# Patient Record
Sex: Female | Born: 1965 | Race: Black or African American | Hispanic: No | Marital: Single | Smoking: Never smoker
Health system: Southern US, Community
[De-identification: ages and names within clinical notes are randomized; demographics above are authoritative.]

## PROBLEM LIST (undated history)

## (undated) DIAGNOSIS — F329 Major depressive disorder, single episode, unspecified: Secondary | ICD-10-CM

## (undated) DIAGNOSIS — F32A Depression, unspecified: Secondary | ICD-10-CM

## (undated) HISTORY — PX: TUBAL LIGATION: SHX77

---

## 1999-06-12 ENCOUNTER — Other Ambulatory Visit: Admission: RE | Admit: 1999-06-12 | Discharge: 1999-06-12 | Payer: Self-pay | Admitting: *Deleted

## 1999-11-28 ENCOUNTER — Inpatient Hospital Stay (HOSPITAL_COMMUNITY): Admission: AD | Admit: 1999-11-28 | Discharge: 1999-11-28 | Payer: Self-pay | Admitting: *Deleted

## 2000-01-31 ENCOUNTER — Ambulatory Visit (HOSPITAL_COMMUNITY): Admission: RE | Admit: 2000-01-31 | Discharge: 2000-01-31 | Payer: Self-pay | Admitting: *Deleted

## 2000-06-22 ENCOUNTER — Inpatient Hospital Stay (HOSPITAL_COMMUNITY): Admission: AD | Admit: 2000-06-22 | Discharge: 2000-06-24 | Payer: Self-pay | Admitting: *Deleted

## 2000-08-16 ENCOUNTER — Emergency Department (HOSPITAL_COMMUNITY): Admission: EM | Admit: 2000-08-16 | Discharge: 2000-08-16 | Payer: Self-pay | Admitting: Internal Medicine

## 2000-08-19 ENCOUNTER — Emergency Department (HOSPITAL_COMMUNITY): Admission: EM | Admit: 2000-08-19 | Discharge: 2000-08-19 | Payer: Self-pay | Admitting: Emergency Medicine

## 2002-09-30 ENCOUNTER — Encounter: Payer: Self-pay | Admitting: Internal Medicine

## 2002-09-30 ENCOUNTER — Encounter: Admission: RE | Admit: 2002-09-30 | Discharge: 2002-09-30 | Payer: Self-pay | Admitting: Internal Medicine

## 2005-09-25 ENCOUNTER — Encounter: Admission: RE | Admit: 2005-09-25 | Discharge: 2005-09-25 | Payer: Self-pay | Admitting: General Surgery

## 2008-03-22 ENCOUNTER — Encounter: Admission: RE | Admit: 2008-03-22 | Discharge: 2008-03-22 | Payer: Self-pay | Admitting: Family Medicine

## 2009-06-02 ENCOUNTER — Encounter: Admission: RE | Admit: 2009-06-02 | Discharge: 2009-06-02 | Payer: Self-pay | Admitting: Family Medicine

## 2009-07-28 ENCOUNTER — Encounter: Admission: RE | Admit: 2009-07-28 | Discharge: 2009-07-28 | Payer: Self-pay | Admitting: Family Medicine

## 2010-11-22 ENCOUNTER — Other Ambulatory Visit: Payer: Self-pay | Admitting: *Deleted

## 2010-11-22 DIAGNOSIS — Z1231 Encounter for screening mammogram for malignant neoplasm of breast: Secondary | ICD-10-CM

## 2010-11-27 ENCOUNTER — Ambulatory Visit
Admission: RE | Admit: 2010-11-27 | Discharge: 2010-11-27 | Disposition: A | Discharge status: Home / Self Care | Payer: 59 | Source: Ambulatory Visit | Attending: *Deleted | Admitting: *Deleted

## 2010-11-27 DIAGNOSIS — Z1231 Encounter for screening mammogram for malignant neoplasm of breast: Secondary | ICD-10-CM

## 2012-07-21 ENCOUNTER — Other Ambulatory Visit: Payer: Self-pay | Admitting: Family Medicine

## 2012-07-21 DIAGNOSIS — N644 Mastodynia: Secondary | ICD-10-CM

## 2012-07-29 ENCOUNTER — Ambulatory Visit
Admission: RE | Admit: 2012-07-29 | Discharge: 2012-07-29 | Disposition: A | Discharge status: Home / Self Care | Payer: 59 | Source: Ambulatory Visit | Attending: Family Medicine | Admitting: Family Medicine

## 2012-07-29 DIAGNOSIS — N644 Mastodynia: Secondary | ICD-10-CM

## 2013-03-13 IMAGING — MG MM DIGITAL DIAGNOSTIC BILAT CAD
4 series · 4 of 4 positions shown · non-contrast
Comparison: 11/27/2010, 07/28/2009, 06/02/2009, 03/22/2008

CLINICAL DATA: The patient has been experiencing pain in the upper
half of each breast for 1 month.

DIGITAL DIAGNOSTIC BILATERAL MAMMOGRAM WITH CAD

[R CC]
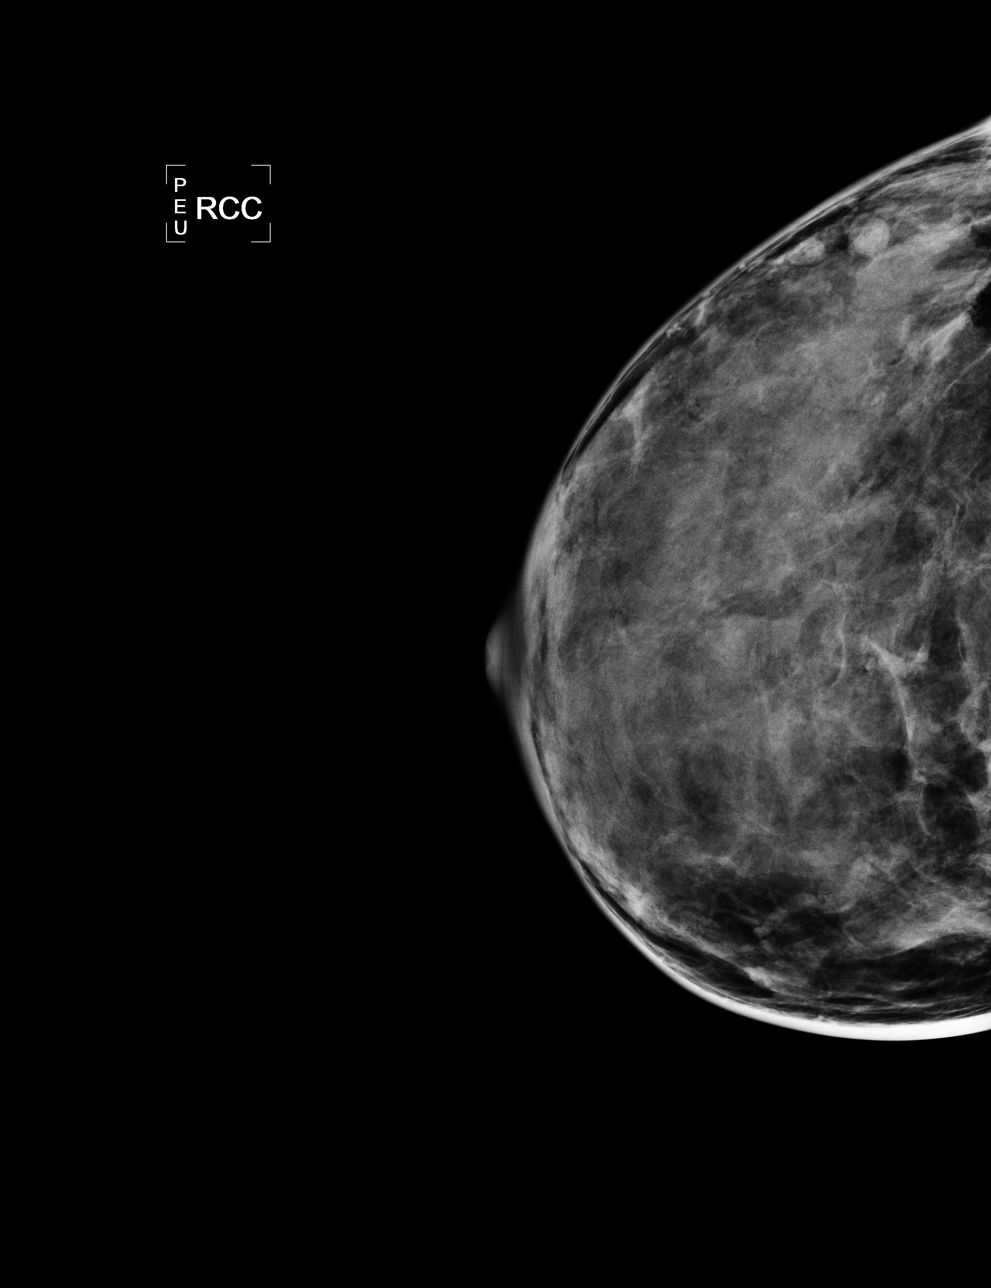

[L CC]
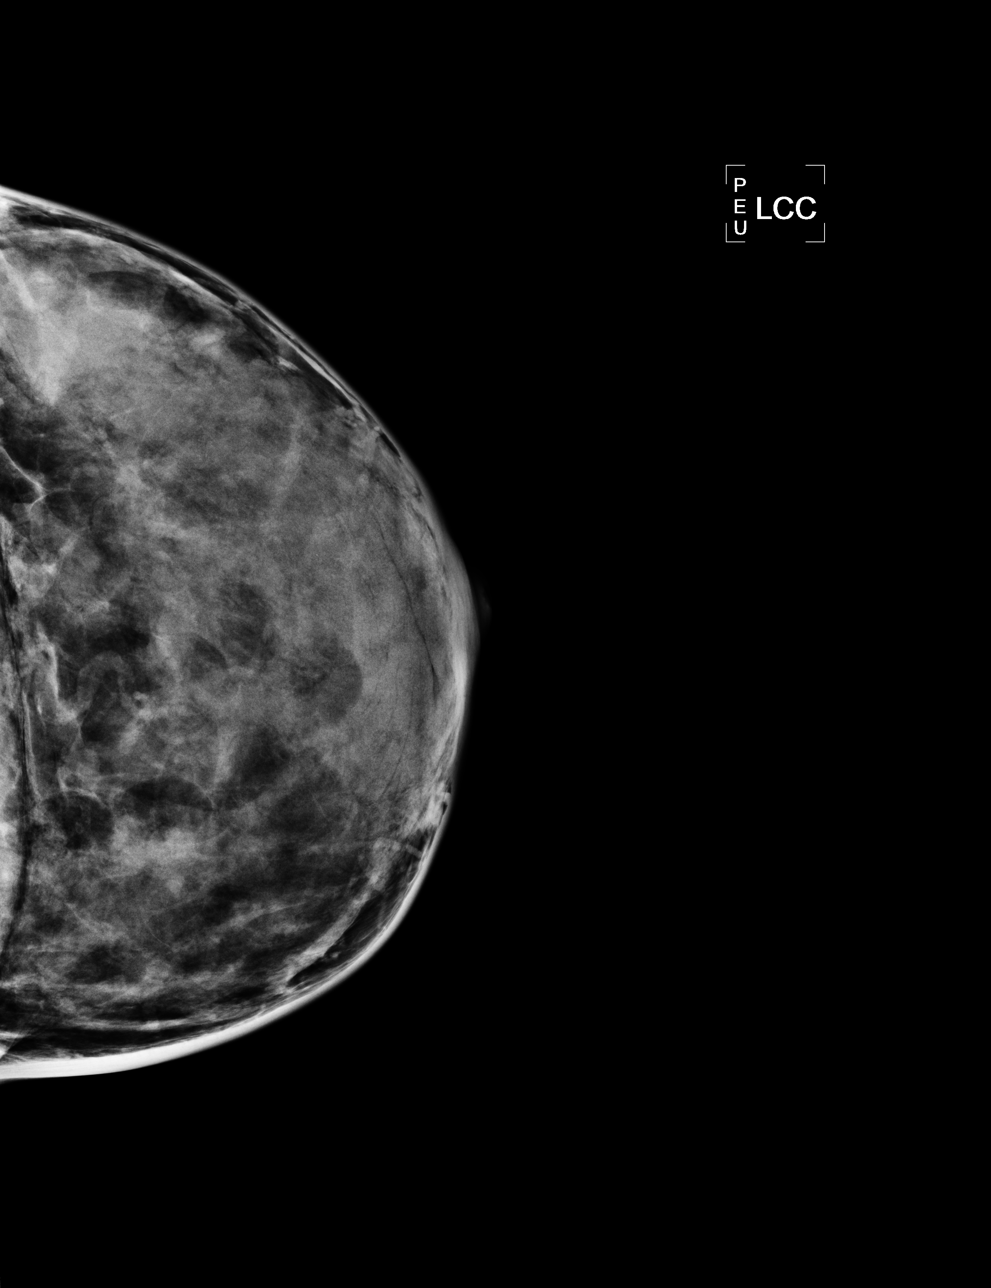

[L MLO]
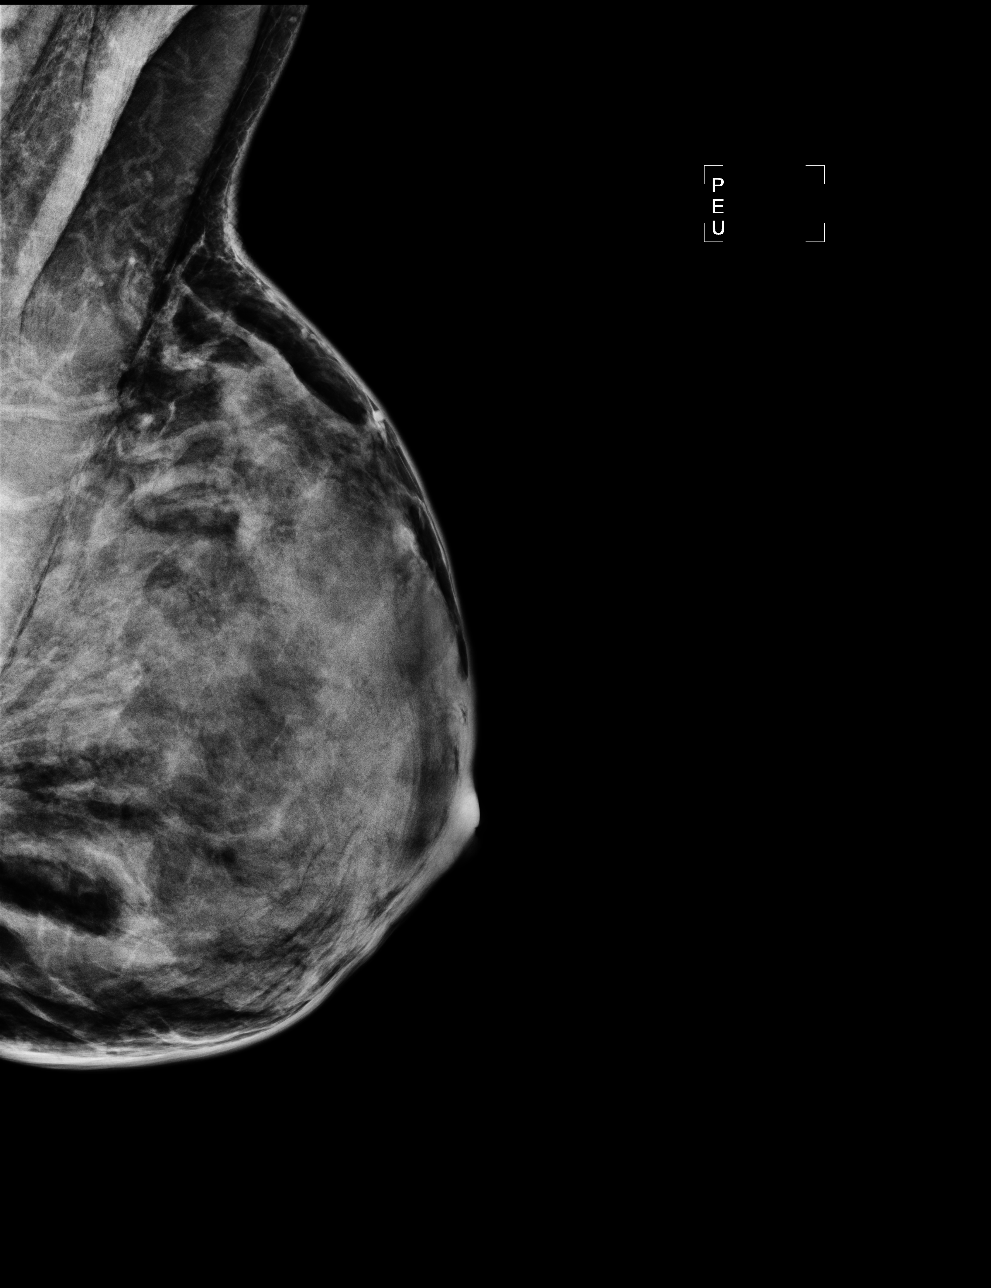

[R MLO]
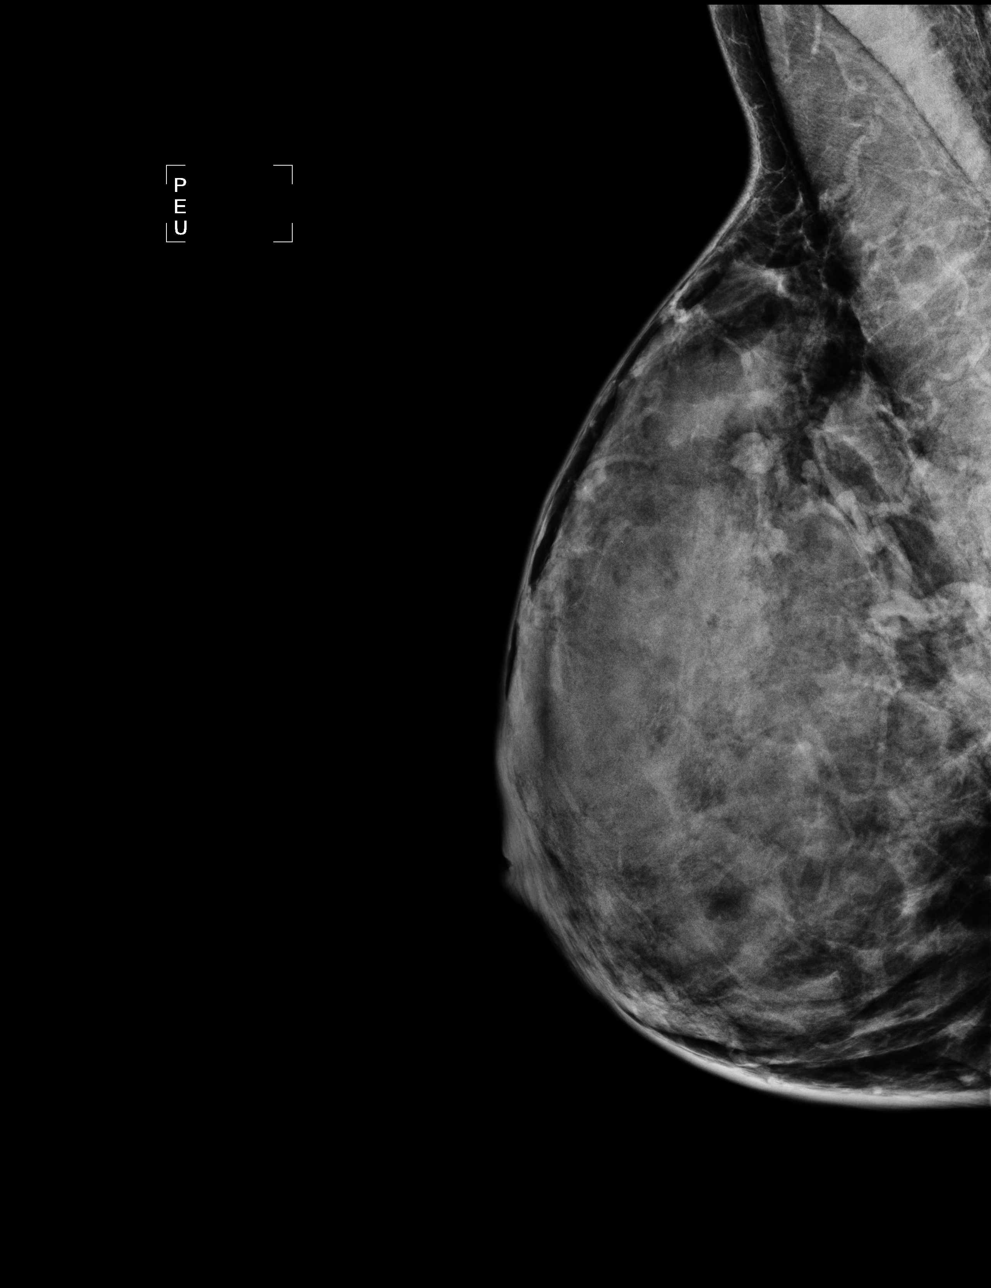

[4 of 4 positions shown; findings below may reference images not displayed]

FINDINGS: The breast tissue is extremely dense.  There is no
suspicious dominant mass, architectural distortion or calcification
to suggest malignancy.
Mammographic images were processed with CAD.
IMPRESSION: No mammographic evidence of malignancy.

RECOMMENDATION:
Yearly screening mammography is suggested.

I have discussed the findings and recommendations with the patient.
Results were also provided in writing at the conclusion of the
visit.

BI-RADS CATEGORY 1:  Negative.

## 2013-11-03 ENCOUNTER — Encounter (HOSPITAL_BASED_OUTPATIENT_CLINIC_OR_DEPARTMENT_OTHER): Payer: Self-pay | Admitting: Emergency Medicine

## 2013-11-03 ENCOUNTER — Emergency Department (HOSPITAL_BASED_OUTPATIENT_CLINIC_OR_DEPARTMENT_OTHER)
Admission: EM | Admit: 2013-11-03 | Discharge: 2013-11-03 | Disposition: A | Discharge status: Home / Self Care | Payer: 59 | Attending: Emergency Medicine | Admitting: Emergency Medicine

## 2013-11-03 DIAGNOSIS — Z79899 Other long term (current) drug therapy: Secondary | ICD-10-CM | POA: Insufficient documentation

## 2013-11-03 DIAGNOSIS — M545 Low back pain, unspecified: Secondary | ICD-10-CM | POA: Insufficient documentation

## 2013-11-03 DIAGNOSIS — R109 Unspecified abdominal pain: Secondary | ICD-10-CM | POA: Insufficient documentation

## 2013-11-03 DIAGNOSIS — F3289 Other specified depressive episodes: Secondary | ICD-10-CM | POA: Insufficient documentation

## 2013-11-03 DIAGNOSIS — F329 Major depressive disorder, single episode, unspecified: Secondary | ICD-10-CM | POA: Insufficient documentation

## 2013-11-03 HISTORY — DX: Depression, unspecified: F32.A

## 2013-11-03 HISTORY — DX: Major depressive disorder, single episode, unspecified: F32.9

## 2013-11-03 LAB — URINALYSIS, ROUTINE W REFLEX MICROSCOPIC
Bilirubin Urine: NEGATIVE
Glucose, UA: NEGATIVE mg/dL
HGB URINE DIPSTICK: NEGATIVE
Ketones, ur: NEGATIVE mg/dL
Leukocytes, UA: NEGATIVE
NITRITE: NEGATIVE
PH: 6 (ref 5.0–8.0)
Protein, ur: NEGATIVE mg/dL
SPECIFIC GRAVITY, URINE: 1.024 (ref 1.005–1.030)
UROBILINOGEN UA: 0.2 mg/dL (ref 0.0–1.0)

## 2013-11-03 MED ORDER — IBUPROFEN 800 MG PO TABS
800.0000 mg | ORAL_TABLET | Freq: Once | ORAL | Status: AC
  Administered 2013-11-03: 800 mg via ORAL
  Filled 2013-11-03: qty 1

## 2013-11-03 MED ORDER — NAPROXEN 500 MG PO TABS: 500.0000 mg | ORAL_TABLET | Freq: Two times a day (BID) | ORAL | Status: AC

## 2013-11-03 MED ORDER — OXYCODONE-ACETAMINOPHEN 5-325 MG PO TABS: 1.0000 | ORAL_TABLET | ORAL | Status: AC | PRN

## 2013-11-03 MED ORDER — ORPHENADRINE CITRATE ER 100 MG PO TB12: 100.0000 mg | ORAL_TABLET | Freq: Two times a day (BID) | ORAL | Status: AC

## 2013-11-03 NOTE — ED Notes (Signed)
Pt reports bilateral lower back pain that radiates to bilateral flank, states that she took tylenol yesterday and pain went away, however pain returned today and did not go away with tylenol, denies difficulty voiding or with bowels moving. Denies musculoskeletal injury. No radiation of pain to lower extremeties

## 2013-11-03 NOTE — ED Notes (Signed)
MD at bedside. 

## 2013-11-03 NOTE — ED Provider Notes (Signed)
CSN: [id number]     Arrival date & time 11/03/13  2236 History   None    Chief Complaint  Patient presents with  . Flank Pain     (Consider location/radiation/quality/duration/timing/severity/associated sxs/prior Treatment) Patient is a 48 y.o. female presenting with flank pain. The history is provided by the patient.  Flank Pain  She complains of pain across her lower back the last 4 days. Pain does not radiate. It is worse with movement or bending. Pain is moderately severe and she rates it at 7/10. Initially, acetaminophen and gave her relief. However, today, acetaminophen did not give her any relief. She denies any urinary urgency, frequency, tenesmus, dysuria. She denies any weakness, numbness, tingling. There's been no bowel or bladder dysfunction. She denies any recent trauma or unusual lifting or bending. She has had back pain in the past.  Past Medical History  Diagnosis Date  . Depression    Past Surgical History  Procedure Laterality Date  . Cesarean section    . Tubal ligation     No family history on file. History  Substance Use Topics  . Smoking status: Never Smoker   . Smokeless tobacco: Not on file  . Alcohol Use: No   OB History   Grav Para Term Preterm Abortions TAB SAB Ect Mult Living                 Review of Systems  Genitourinary: Positive for flank pain.  All other systems reviewed and are negative.      Allergies  Review of patient's allergies indicates no known allergies.  Home Medications   Current Outpatient Rx  Name  Route  Sig  Dispense  Refill  . buPROPion (WELLBUTRIN) 100 MG tablet   Oral   Take 100 mg by mouth daily.          BP 113/72  Pulse 74  Temp(Src) 98.3 F (36.8 C) (Oral)  Resp 18  Ht 5\' 7"  (1.702 m)  Wt 130 lb (58.968 kg)  BMI 20.36 kg/m2  SpO2 99%  LMP 10/09/2013 Physical Exam  Nursing note and vitals reviewed.  48 year old female, resting comfortably and in no acute distress. Vital signs are normal.  Oxygen saturation is 99%, which is normal. Head is normocephalic and atraumatic. PERRLA, EOMI. Oropharynx is clear. Neck is nontender and supple without adenopathy or JVD. Back is is mildly tender in the mid lumbar area. There is moderate paralumbar spasm which is worse on the right. Straight leg raise is negative. There is no CVA tenderness. Lungs are clear without rales, wheezes, or rhonchi. Chest is nontender. Heart has regular rate and rhythm without murmur. Abdomen is soft, flat, nontender without masses or hepatosplenomegaly and peristalsis is normoactive. Extremities have no cyanosis or edema, full range of motion is present. Skin is warm and dry without rash. Neurologic: Mental status is normal, cranial nerves are intact, there are no motor or sensory deficits.  ED Course  Procedures (including critical care time) Labs Review Results for orders placed during the hospital encounter of 11/03/13  URINALYSIS, ROUTINE W REFLEX MICROSCOPIC      Result Value Ref Range   Color, Urine YELLOW  YELLOW   APPearance CLEAR  CLEAR   Specific Gravity, Urine 1.024  1.005 - 1.030   pH 6.0  5.0 - 8.0   Glucose, UA NEGATIVE  NEGATIVE mg/dL   Hgb urine dipstick NEGATIVE  NEGATIVE   Bilirubin Urine NEGATIVE  NEGATIVE   Ketones, ur NEGATIVE  NEGATIVE mg/dL   Protein, ur NEGATIVE  NEGATIVE mg/dL   Urobilinogen, UA 0.2  0.0 - 1.0 mg/dL   Nitrite NEGATIVE  NEGATIVE   Leukocytes, UA NEGATIVE  NEGATIVE   MDM   Final diagnoses:  Low back pain    Musculoskeletal low-back pain. No indication for imaging today. Urinalysis is normal. She is discharged with prescriptions for naproxen, orphenadrine, and oxycodone-acetaminophen.    Dione Boozeavid Braxton Vantrease, MD 11/03/13 [phone]

## 2013-11-03 NOTE — ED Notes (Signed)
Bilateral flank pain intermittently x4 days. Increased pain with movement.

## 2013-11-03 NOTE — Discharge Instructions (Signed)
Back Pain, Adult °Low back pain is very common. About 1 in 5 people have back pain. The cause of low back pain is rarely dangerous. The pain often gets better over time. About half of people with a sudden onset of back pain feel better in just 2 weeks. About 8 in 10 people feel better by 6 weeks.  °CAUSES °Some common causes of back pain include: °· Strain of the muscles or ligaments supporting the spine. °· Wear and tear (degeneration) of the spinal discs. °· Arthritis. °· Direct injury to the back. °DIAGNOSIS °Most of the time, the direct cause of low back pain is not known. However, back pain can be treated effectively even when the exact cause of the pain is unknown. Answering your caregiver's questions about your overall health and symptoms is one of the most accurate ways to make sure the cause of your pain is not dangerous. If your caregiver needs more information, he or she may order lab work or imaging tests (X-rays or MRIs). However, even if imaging tests show changes in your back, this usually does not require surgery. °HOME CARE INSTRUCTIONS °For many people, back pain returns. Since low back pain is rarely dangerous, it is often a condition that people can learn to manage on their own.  °· Remain active. It is stressful on the back to sit or stand in one place. Do not sit, drive, or stand in one place for more than 30 minutes at a time. Take short walks on level surfaces as soon as pain allows. Try to increase the length of time you walk each day. °· Do not stay in bed. Resting more than 1 or 2 days can delay your recovery. °· Do not avoid exercise or work. Your body is made to move. It is not dangerous to be active, even though your back may hurt. Your back will likely heal faster if you return to being active before your pain is gone. °· Pay attention to your body when you  bend and lift. Many people have less discomfort when lifting if they bend their knees, keep the load close to their bodies, and  avoid twisting. Often, the most comfortable positions are those that put less stress on your recovering back. °· Find a comfortable position to sleep. Use a firm mattress and lie on your side with your knees slightly bent. If you lie on your back, put a pillow under your knees. °· Only take over-the-counter or prescription medicines as directed by your caregiver. Over-the-counter medicines to reduce pain and inflammation are often the most helpful. Your caregiver may prescribe muscle relaxant drugs. These medicines help dull your pain so you can more quickly return to your normal activities and healthy exercise. °· Put ice on the injured area. °· Put ice in a plastic bag. °· Place a towel between your skin and the bag. °· Leave the ice on for 15-20 minutes, 03-04 times a day for the first 2 to 3 days. After that, ice and heat may be alternated to reduce pain and spasms. °· Ask your caregiver about trying back exercises and gentle massage. This may be of some benefit. °· Avoid feeling anxious or stressed. Stress increases muscle tension and can worsen back pain. It is important to recognize when you are anxious or stressed and learn ways to manage it. Exercise is a great option. °SEEK MEDICAL CARE IF: °· You have pain that is not relieved with rest or medicine. °· You have pain that does not improve in 1 week. °· You have new symptoms. °· You are generally not feeling well. °SEEK   IMMEDIATE MEDICAL CARE IF:  °· You have pain that radiates from your back into your legs. °· You develop new bowel or bladder control problems. °· You have unusual weakness or numbness in your arms or legs. °· You develop nausea or vomiting. °· You develop abdominal pain. °· You feel faint. °Document Released: 08/26/2005 Document Revised: 02/25/2012 Document Reviewed: 01/14/2011 °[employer]® Patient Information ©2014 [employer]. ° °Naproxen and naproxen sodium oral immediate-release tablets °What is this medicine? °NAPROXEN (na PROX en) is  a non-steroidal anti-inflammatory drug (NSAID). It is used to reduce swelling and to treat pain. This medicine may be used for dental pain, headache, or painful monthly periods. It is also used for painful joint and muscular problems such as arthritis, tendinitis, bursitis, and gout. °This medicine may be used for other purposes; ask your health care provider or pharmacist if you have questions. °COMMON BRAND NAME(S): Aflaxen, Aleve Arthritis, Aleve, All Day Relief, Anaprox DS, Anaprox, Naprosyn °What should I tell my health care provider before I take this medicine? °They need to know if you have any of these conditions: °-asthma °-cigarette smoker °-drink more than 3 alcohol containing drinks a day °-heart disease or circulation problems such as heart failure or leg edema (fluid retention) °-high blood pressure °-kidney disease °-liver disease °-stomach bleeding or ulcers °-an unusual or allergic reaction to naproxen, aspirin, other NSAIDs, other medicines, foods, dyes, or preservatives °-pregnant or trying to get pregnant °-breast-feeding °How should I use this medicine? °Take this medicine by mouth with a glass of water. Follow the directions on the prescription label. Take it with food if your stomach gets upset. Try to not lie down for at least 10 minutes after you take it. Take your medicine at regular intervals. Do not take your medicine more often than directed. Long-term, continuous use may increase the risk of heart attack or stroke. °A special MedGuide will be given to you by the pharmacist with each prescription and refill. Be sure to read this information carefully each time. °Talk to your pediatrician regarding the use of this medicine in children. Special care may be needed. °Overdosage: If you think you have taken too much of this medicine contact a poison control center or emergency room at once. °NOTE: This medicine is only for you. Do not share this medicine with others. °What if I miss a  dose? °If you miss a dose, take it as soon as you can. If it is almost time for your next dose, take only that dose. Do not take double or extra doses. °What may interact with this medicine? °-alcohol °-aspirin °-cidofovir °-diuretics °-lithium °-methotrexate °-other drugs for inflammation like ketorolac or prednisone °-pemetrexed °-probenecid °-warfarin °This list may not describe all possible interactions. Give your health care provider a list of all the medicines, herbs, non-prescription drugs, or dietary supplements you use. Also tell them if you smoke, drink alcohol, or use illegal drugs. Some items may interact with your medicine. °What should I watch for while using this medicine? °Tell your doctor or health care professional if your pain does not get better. Talk to your doctor before taking another medicine for pain. Do not treat yourself. °This medicine does not prevent heart attack or stroke. In fact, this medicine may increase the chance of a heart attack or stroke. The chance may increase with longer use of this medicine and in people who have heart disease. If you take aspirin to prevent heart attack or stroke, talk with your doctor or health   care professional. °Do not take other medicines that contain aspirin, ibuprofen, or naproxen with this medicine. Side effects such as stomach upset, nausea, or ulcers may be more likely to occur. Many medicines available without a prescription should not be taken with this medicine. °This medicine can cause ulcers and bleeding in the stomach and intestines at any time during treatment. Do not smoke cigarettes or drink alcohol. These increase irritation to your stomach and can make it more susceptible to damage from this medicine. Ulcers and bleeding can happen without warning symptoms and can cause death. °You may get drowsy or dizzy. Do not drive, use machinery, or do anything that needs mental alertness until you know how this medicine affects you. Do not stand  or sit up quickly, especially if you are an older patient. This reduces the risk of dizzy or fainting spells. °This medicine can cause you to bleed more easily. Try to avoid damage to your teeth and gums when you brush or floss your teeth. °What side effects may I notice from receiving this medicine? °Side effects that you should report to your doctor or health care professional as soon as possible: °-black or bloody stools, blood in the urine or vomit °-blurred vision °-chest pain °-difficulty breathing or wheezing °-nausea or vomiting °-severe stomach pain °-skin rash, skin redness, blistering or peeling skin, hives, or itching °-slurred speech or weakness on one side of the body °-swelling of eyelids, throat, lips °-unexplained weight gain or swelling °-unusually weak or tired °-yellowing of eyes or skin °Side effects that usually do not require medical attention (report to your doctor or health care professional if they continue or are bothersome): °-constipation °-headache °-heartburn °This list may not describe all possible side effects. Call your doctor for medical advice about side effects. You may report side effects to FDA at 1-800-FDA-1088. °Where should I keep my medicine? °Keep out of the reach of children. °Store at room temperature between 15 and 30 degrees C (59 and 86 degrees F). Keep container tightly closed. Throw away any unused medicine after the expiration date. °NOTE: This sheet is a summary. It may not cover all possible information. If you have questions about this medicine, talk to your doctor, pharmacist, or health care provider. °© 2014, [employer]. (2009-08-28 20:10:16) ° °Orphenadrine tablets °What is this medicine? °ORPHENADRINE (or FEN a dreen) helps to relieve pain and stiffness in muscles and can treat muscle spasms. °This medicine may be used for other purposes; ask your health care provider or pharmacist if you have questions. °COMMON BRAND NAME(S): Norflex °What  should I tell my health care provider before I take this medicine? °They need to know if you have any of these conditions: °-glaucoma °-heart disease °-kidney disease °-myasthenia gravis °-peptic ulcer disease °-prostate disease °-stomach problems °-an unusual or allergic reaction to orphenadrine, other medicines, foods, lactose, dyes, or preservatives °-pregnant or trying to get pregnant °-breast-feeding °How should I use this medicine? °Take this medicine by mouth with a full glass of water. Follow the directions on the prescription label. Take your medicine at regular intervals. Do not take your medicine more often than directed. Do not take more than you are told to take. °Talk to your pediatrician regarding the use of this medicine in children. Special care may be needed. °Patients over 65 years old may have a stronger reaction and need a smaller dose. °Overdosage: If you think you have taken too much of this medicine contact a poison control center or emergency   room at once. °NOTE: This medicine is only for you. Do not share this medicine with others. °What if I miss a dose? °If you miss a dose, take it as soon as you can. If it is almost time for your next dose, take only that dose. Do not take double or extra doses. °What may interact with this medicine? °-alcohol °-antihistamines °-barbiturates, like phenobarbital °-benzodiazepines °-cyclobenzaprine °-medicines for pain °-phenothiazines like chlorpromazine, mesoridazine, prochlorperazine, thioridazine °This list may not describe all possible interactions. Give your health care provider a list of all the medicines, herbs, non-prescription drugs, or dietary supplements you use. Also tell them if you smoke, drink alcohol, or use illegal drugs. Some items may interact with your medicine. °What should I watch for while using this medicine? °Your mouth may get dry. Chewing sugarless gum or sucking hard candy, and drinking plenty of water may help. Contact your  doctor if the problem does not go away or is severe. °This medicine may cause dry eyes and blurred vision. If you wear contact lenses you may feel some discomfort. Lubricating drops may help. See your eye doctor if the problem does not go away or is severe. °You may get drowsy or dizzy. Do not drive, use machinery, or do anything that needs mental alertness until you know how this medicine affects you. Do not stand or sit up quickly, especially if you are an older patient. This reduces the risk of dizzy or fainting spells. Alcohol may interfere with the effect of this medicine. Avoid alcoholic drinks. °What side effects may I notice from receiving this medicine? °Side effects that you should report to your doctor or health care professional as soon as possible: °-allergic reactions like skin rash, itching or hives, swelling of the face, lips, or tongue °-changes in vision °-difficulty breathing °-fast heartbeat or palpitations °-hallucinations °-light headedness, fainting spells °-vomiting °Side effects that usually do not require medical attention (report to your doctor or health care professional if they continue or are bothersome): °-dizziness °-drowsiness °-headache °-nausea °This list may not describe all possible side effects. Call your doctor for medical advice about side effects. You may report side effects to FDA at 1-800-FDA-1088. °Where should I keep my medicine? °Keep out of the reach of children. °Store at room temperature between 15 and 30 degrees C (59 and 86 degrees F). Protect from light. Keep container tightly closed. Throw away any unused medicine after the expiration date. °NOTE: This sheet is a summary. It may not cover all possible information. If you have questions about this medicine, talk to your doctor, pharmacist, or health care provider. °© 2014, [employer]. (2008-03-22 17:19:12) ° °Acetaminophen; Oxycodone tablets °What is this medicine? °ACETAMINOPHEN; OXYCODONE (a set a MEE  noe fen; ox i KOE done) is a pain reliever. It is used to treat mild to moderate pain. °This medicine may be used for other purposes; ask your health care provider or pharmacist if you have questions. °COMMON BRAND NAME(S): Endocet, Magnacet, Narvox, Percocet, Perloxx, Primalev, Primlev, Roxicet, Xolox °What should I tell my health care provider before I take this medicine? °They need to know if you have any of these conditions: °-brain tumor °-Crohn's disease, inflammatory bowel disease, or ulcerative colitis °-drug abuse or addiction °-head injury °-heart or circulation problems °-if you often drink alcohol °-kidney disease or problems going to the bathroom °-liver disease °-lung disease, asthma, or breathing problems °-an unusual or allergic reaction to acetaminophen, oxycodone, other opioid analgesics, other medicines, foods, dyes, or preservatives °-  pregnant or trying to get pregnant °-breast-feeding °How should I use this medicine? °Take this medicine by mouth with a full glass of water. Follow the directions on the prescription label. Take your medicine at regular intervals. Do not take your medicine more often than directed. °Talk to your pediatrician regarding the use of this medicine in children. Special care may be needed. °Patients over 65 years old may have a stronger reaction and need a smaller dose. °Overdosage: If you think you have taken too much of this medicine contact a poison control center or emergency room at once. °NOTE: This medicine is only for you. Do not share this medicine with others. °What if I miss a dose? °If you miss a dose, take it as soon as you can. If it is almost time for your next dose, take only that dose. Do not take double or extra doses. °What may interact with this medicine? °-alcohol °-antihistamines °-barbiturates like amobarbital, butalbital, butabarbital, methohexital, pentobarbital, phenobarbital, thiopental, and secobarbital °-benztropine °-drugs for bladder  problems like solifenacin, trospium, oxybutynin, tolterodine, hyoscyamine, and methscopolamine °-drugs for breathing problems like ipratropium and tiotropium °-drugs for certain stomach or intestine problems like propantheline, homatropine methylbromide, glycopyrrolate, atropine, belladonna, and dicyclomine °-general anesthetics like etomidate, ketamine, nitrous oxide, propofol, desflurane, enflurane, halothane, isoflurane, and sevoflurane °-medicines for depression, anxiety, or psychotic disturbances °-medicines for sleep °-muscle relaxants °-naltrexone °-narcotic medicines (opiates) for pain °-phenothiazines like perphenazine, thioridazine, chlorpromazine, mesoridazine, fluphenazine, prochlorperazine, promazine, and trifluoperazine °-scopolamine °-tramadol °-trihexyphenidyl °This list may not describe all possible interactions. Give your health care provider a list of all the medicines, herbs, non-prescription drugs, or dietary supplements you use. Also tell them if you smoke, drink alcohol, or use illegal drugs. Some items may interact with your medicine. °What should I watch for while using this medicine? °Tell your doctor or health care professional if your pain does not go away, if it gets worse, or if you have new or a different type of pain. You may develop tolerance to the medicine. Tolerance means that you will need a higher dose of the medication for pain relief. Tolerance is normal and is expected if you take this medicine for a long time. °Do not suddenly stop taking your medicine because you may develop a severe reaction. Your body becomes used to the medicine. This does NOT mean you are addicted. Addiction is a behavior related to getting and using a drug for a non-medical reason. If you have pain, you have a medical reason to take pain medicine. Your doctor will tell you how much medicine to take. If your doctor wants you to stop the medicine, the dose will be slowly lowered over time to avoid any  side effects. °You may get drowsy or dizzy. Do not drive, use machinery, or do anything that needs mental alertness until you know how this medicine affects you. Do not stand or sit up quickly, especially if you are an older patient. This reduces the risk of dizzy or fainting spells. Alcohol may interfere with the effect of this medicine. Avoid alcoholic drinks. °There are different types of narcotic medicines (opiates) for pain. If you take more than one type at the same time, you may have more side effects. Give your health care provider a list of all medicines you use. Your doctor will tell you how much medicine to take. Do not take more medicine than directed. Call emergency for help if you have problems breathing. °The medicine will cause constipation. Try to have a   bowel movement at least every 2 to 3 days. If you do not have a bowel movement for 3 days, call your doctor or health care professional. °Do not take Tylenol (acetaminophen) or medicines that have acetaminophen with this medicine. Too much acetaminophen can be very dangerous. Many nonprescription medicines contain acetaminophen. Always read the labels carefully to avoid taking more acetaminophen. °What side effects may I notice from receiving this medicine? °Side effects that you should report to your doctor or health care professional as soon as possible: °-allergic reactions like skin rash, itching or hives, swelling of the face, lips, or tongue °-breathing difficulties, wheezing °-confusion °-light headedness or fainting spells °-severe stomach pain °-unusually weak or tired °-yellowing of the skin or the whites of the eyes  °Side effects that usually do not require medical attention (report to your doctor or health care professional if they continue or are bothersome): °-dizziness °-drowsiness °-nausea °-vomiting °This list may not describe all possible side effects. Call your doctor for medical advice about side effects. You may report side  effects to FDA at 1-800-FDA-1088. °Where should I keep my medicine? °Keep out of the reach of children. This medicine can be abused. Keep your medicine in a safe place to protect it from theft. Do not share this medicine with anyone. Selling or giving away this medicine is dangerous and against the law. °Store at room temperature between 20 and 25 degrees C (68 and 77 degrees F). Keep container tightly closed. Protect from light. °This medicine may cause accidental overdose and death if it is taken by other adults, children, or pets. Flush any unused medicine down the toilet to reduce the chance of harm. Do not use the medicine after the expiration date. °NOTE: This sheet is a summary. It may not cover all possible information. If you have questions about this medicine, talk to your doctor, pharmacist, or health care provider. °© 2014, [employer]. (2013-04-19 13:17:35) ° °

## 2021-05-31 ENCOUNTER — Encounter (HOSPITAL_BASED_OUTPATIENT_CLINIC_OR_DEPARTMENT_OTHER): Payer: Self-pay

## 2021-05-31 ENCOUNTER — Other Ambulatory Visit: Payer: Self-pay

## 2021-05-31 ENCOUNTER — Emergency Department (HOSPITAL_BASED_OUTPATIENT_CLINIC_OR_DEPARTMENT_OTHER)
Admission: EM | Admit: 2021-05-31 | Discharge: 2021-05-31 | Disposition: A | Discharge status: Home / Self Care | Payer: 59 | Attending: Emergency Medicine | Admitting: Emergency Medicine

## 2021-05-31 ENCOUNTER — Emergency Department (HOSPITAL_BASED_OUTPATIENT_CLINIC_OR_DEPARTMENT_OTHER): Payer: 59

## 2021-05-31 DIAGNOSIS — R079 Chest pain, unspecified: Secondary | ICD-10-CM | POA: Insufficient documentation

## 2021-05-31 DIAGNOSIS — R0602 Shortness of breath: Secondary | ICD-10-CM | POA: Insufficient documentation

## 2021-05-31 LAB — CBC
HCT: 36.4 % (ref 36.0–46.0)
Hemoglobin: 11.5 g/dL — ABNORMAL LOW (ref 12.0–15.0)
MCH: 28.5 pg (ref 26.0–34.0)
MCHC: 31.6 g/dL (ref 30.0–36.0)
MCV: 90.1 fL (ref 80.0–100.0)
Platelets: 270 10*3/uL (ref 150–400)
RBC: 4.04 MIL/uL (ref 3.87–5.11)
RDW: 12.1 % (ref 11.5–15.5)
WBC: 6.2 10*3/uL (ref 4.0–10.5)
nRBC: 0 % (ref 0.0–0.2)

## 2021-05-31 LAB — BASIC METABOLIC PANEL
Anion gap: 6 (ref 5–15)
BUN: 13 mg/dL (ref 6–20)
CO2: 30 mmol/L (ref 22–32)
Calcium: 9.4 mg/dL (ref 8.9–10.3)
Chloride: 103 mmol/L (ref 98–111)
Creatinine, Ser: 0.68 mg/dL (ref 0.44–1.00)
GFR, Estimated: >60 mL/min (ref >60)
Glucose, Bld: 109 mg/dL — ABNORMAL HIGH (ref 70–99)
Potassium: 3.3 mmol/L — ABNORMAL LOW (ref 3.5–5.1)
Sodium: 139 mmol/L (ref 135–145)

## 2021-05-31 LAB — TROPONIN I (HIGH SENSITIVITY)
Troponin I (High Sensitivity): <2 ng/L (ref <18)
Troponin I (High Sensitivity): <2 ng/L (ref <18)

## 2021-05-31 NOTE — ED Triage Notes (Signed)
Pt c/o intermittent CP x 1 week-NAD-steady gait 

## 2021-05-31 NOTE — ED Notes (Signed)
Patient given discharge instructions, all questions answered. Patient in possession of all belongings, directed to the discharge area  

## 2021-05-31 NOTE — Discharge Instructions (Signed)
Follow-up with your primary care physician for additional evaluation.  Your work-up today was very reassuring, no signs of heart strain or heart attack on your work-up.  Chest x-ray came back normal without any signs of pneumonia.  Take Tylenol and Motrin as needed for pain.  If things change or worsen return back to the ED as needed.

## 2021-05-31 NOTE — ED Provider Notes (Signed)
[facility] EMERGENCY DEPARTMENT Provider Note   CSN: [id number] Arrival date & time: 05/31/21  1419     History Chief Complaint  Patient presents with   Chest Pain    Christina Rice is a 55 y.o. female.   Chest Pain Associated symptoms: shortness of breath   Associated symptoms: no abdominal pain, no fatigue, no fever, no nausea and no vomiting    Patient presents with chest pain x1 week.  It is intermittent, last for 5 seconds when it occurs. Occurs 1-2x daily. Random pattern, unable to identify chronicity. It is located in the middle of her chest, feels sharp.  She associated shortness of breath, no nausea or vomiting.  It is not worsened by exertion, it radiates to the left arm and feels sore like she was given a shot.  No swelling in the legs.  Patient does not smoke cigarettes, no medical history of hypertension, diabetes, high cholesterol.  No primary family members with history of heart disease.  Past Medical History:  Diagnosis Date   Depression     There are no problems to display for this patient.   Past Surgical History:  Procedure Laterality Date   CESAREAN SECTION     TUBAL LIGATION       OB History   No obstetric history on file.     No family history on file.  Social History   Tobacco Use   Smoking status: Never   Smokeless tobacco: Never  Substance Use Topics   Alcohol use: No   Drug use: No    Home Medications Prior to Admission medications   Medication Sig Start Date End Date Taking? Authorizing Provider  buPROPion (WELLBUTRIN) 100 MG tablet Take 100 mg by mouth daily.    [provider]  naproxen (NAPROSYN) 500 MG tablet Take 1 tablet (500 mg total) by mouth 2 (two) times daily. 11/03/13   Dione Booze, MD  orphenadrine (NORFLEX) 100 MG tablet Take 1 tablet (100 mg total) by mouth 2 (two) times daily. 11/03/13   Dione Booze, MD  oxyCODONE-acetaminophen (ROXICET) 5-325 MG per tablet Take 1 tablet by mouth every 4 (four)  hours as needed for severe pain. 11/03/13   Dione Booze, MD    Allergies    Other  Review of Systems   Review of Systems  Constitutional:  Negative for fatigue and fever.  Respiratory:  Positive for shortness of breath. Negative for wheezing.   Cardiovascular:  Positive for chest pain. Negative for leg swelling.  Gastrointestinal:  Negative for abdominal pain, nausea and vomiting.   Physical Exam Updated Vital Signs BP 120/82   Pulse 73   Temp 98.3 F (36.8 C) (Oral)   Resp 16   Ht 5\' 7"  (1.702 m)   Wt 68.5 kg   SpO2 99%   BMI 23.65 kg/m   Physical Exam Vitals and nursing note reviewed. Exam conducted with a chaperone present.  Constitutional:      Appearance: Normal appearance.  HENT:     Head: Normocephalic and atraumatic.  Eyes:     General: No scleral icterus.       Right eye: No discharge.        Left eye: No discharge.     Extraocular Movements: Extraocular movements intact.     Pupils: Pupils are equal, round, and reactive to light.  Cardiovascular:     Rate and Rhythm: Normal rate and regular rhythm.     Pulses: Normal pulses.  Heart sounds: Normal heart sounds. No murmur heard.   No friction rub. No gallop.  Pulmonary:     Effort: Pulmonary effort is normal. No respiratory distress.     Breath sounds: Normal breath sounds.  Abdominal:     General: Abdomen is flat. Bowel sounds are normal. There is no distension.     Palpations: Abdomen is soft.     Tenderness: There is no abdominal tenderness.  Musculoskeletal:     Comments: Legs are roughly symmetric, no edema  Skin:    General: Skin is warm and dry.     Coloration: Skin is not jaundiced.  Neurological:     Mental Status: She is alert. Mental status is at baseline.     Coordination: Coordination normal.    ED Results / Procedures / Treatments   Labs (all labs ordered are listed, but only abnormal results are displayed) Labs Reviewed  BASIC METABOLIC PANEL - Abnormal; Notable for the  following components:      Result Value   Potassium 3.3 (*)    Glucose, Bld 109 (*)    All other components within normal limits  CBC - Abnormal; Notable for the following components:   Hemoglobin 11.5 (*)    All other components within normal limits  TROPONIN I (HIGH SENSITIVITY)  TROPONIN I (HIGH SENSITIVITY)    EKG None  Radiology DG Chest 2 View  Result Date: 05/31/2021 CLINICAL DATA:  Chest pain EXAM: CHEST - 2 VIEW COMPARISON:  None. FINDINGS: The heart size and mediastinal contours are within normal limits. Both lungs are clear. The visualized skeletal structures are unremarkable. IMPRESSION: No active cardiopulmonary disease. Electronically Signed   By: Darliss Cheney M.D.   On: 05/31/2021 15:08    Procedures Procedures   Medications Ordered in ED Medications - No data to display  ED Course  I have reviewed the triage vital signs and the nursing notes.  Pertinent labs & imaging results that were available during my care of the patient were reviewed by me and considered in my medical decision making (see chart for details).     MDM Rules/Calculators/A&P                           Patient vitals are stable, no tachycardia or hypoxia.  No tachypnea.  She is not having any chest pain at the moment.  No ST elevation or depression on EKG concerning for ACS.  Initial troponin negative, given pain is been going on for greater than 12 hours today and for a week in total I do not think a second troponin would be beneficial.  Heart score is 2.  Given her to tachycardia or hypoxia I doubt this is a PE.  Patient does not smoke or have high blood pressure, doubt any dissection.  Delta troponin negative.  Doubt ACS.  Patient is stable, appropriate for discharge at this time.  Discussed HPI, physical exam and plan of care for this patient with attending Dr. Marianna Fuss. The attending physician evaluated this patient as part of a shared visit and agrees with plan of care.   Final  Clinical Impression(s) / ED Diagnoses Final diagnoses:  Chest pain, unspecified type    Rx / DC Orders ED Discharge Orders     None        Theron Arista, Cordelia Poche 05/31/21 1905    Milagros Loll, MD 06/04/21 [phone]

## 2022-01-13 IMAGING — CR DG CHEST 2V
2 series · 2 of 2 positions shown · non-contrast
Comparison: None.

CLINICAL DATA: Chest pain

EXAM:
CHEST - 2 VIEW

[w chest pa]
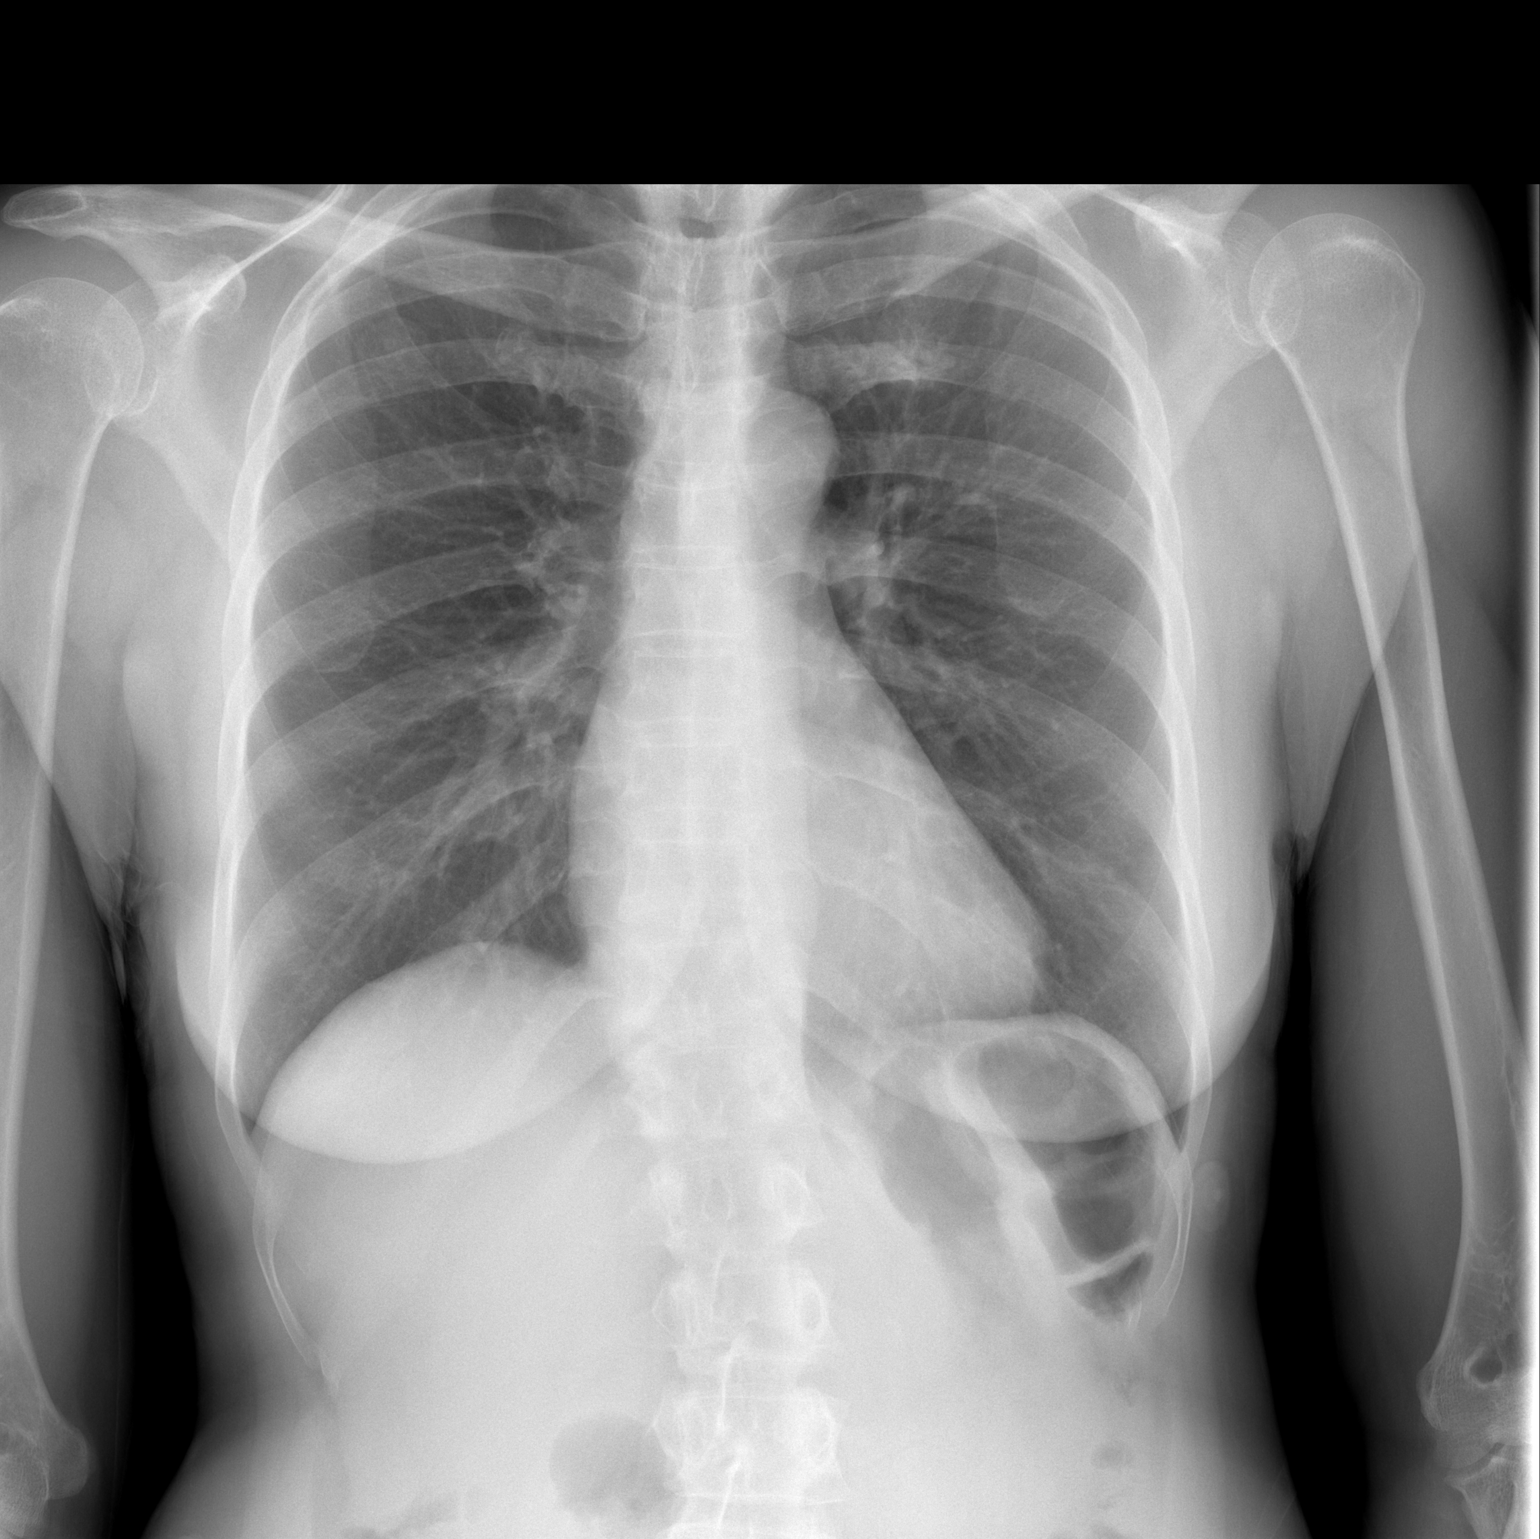

[w chest lat]
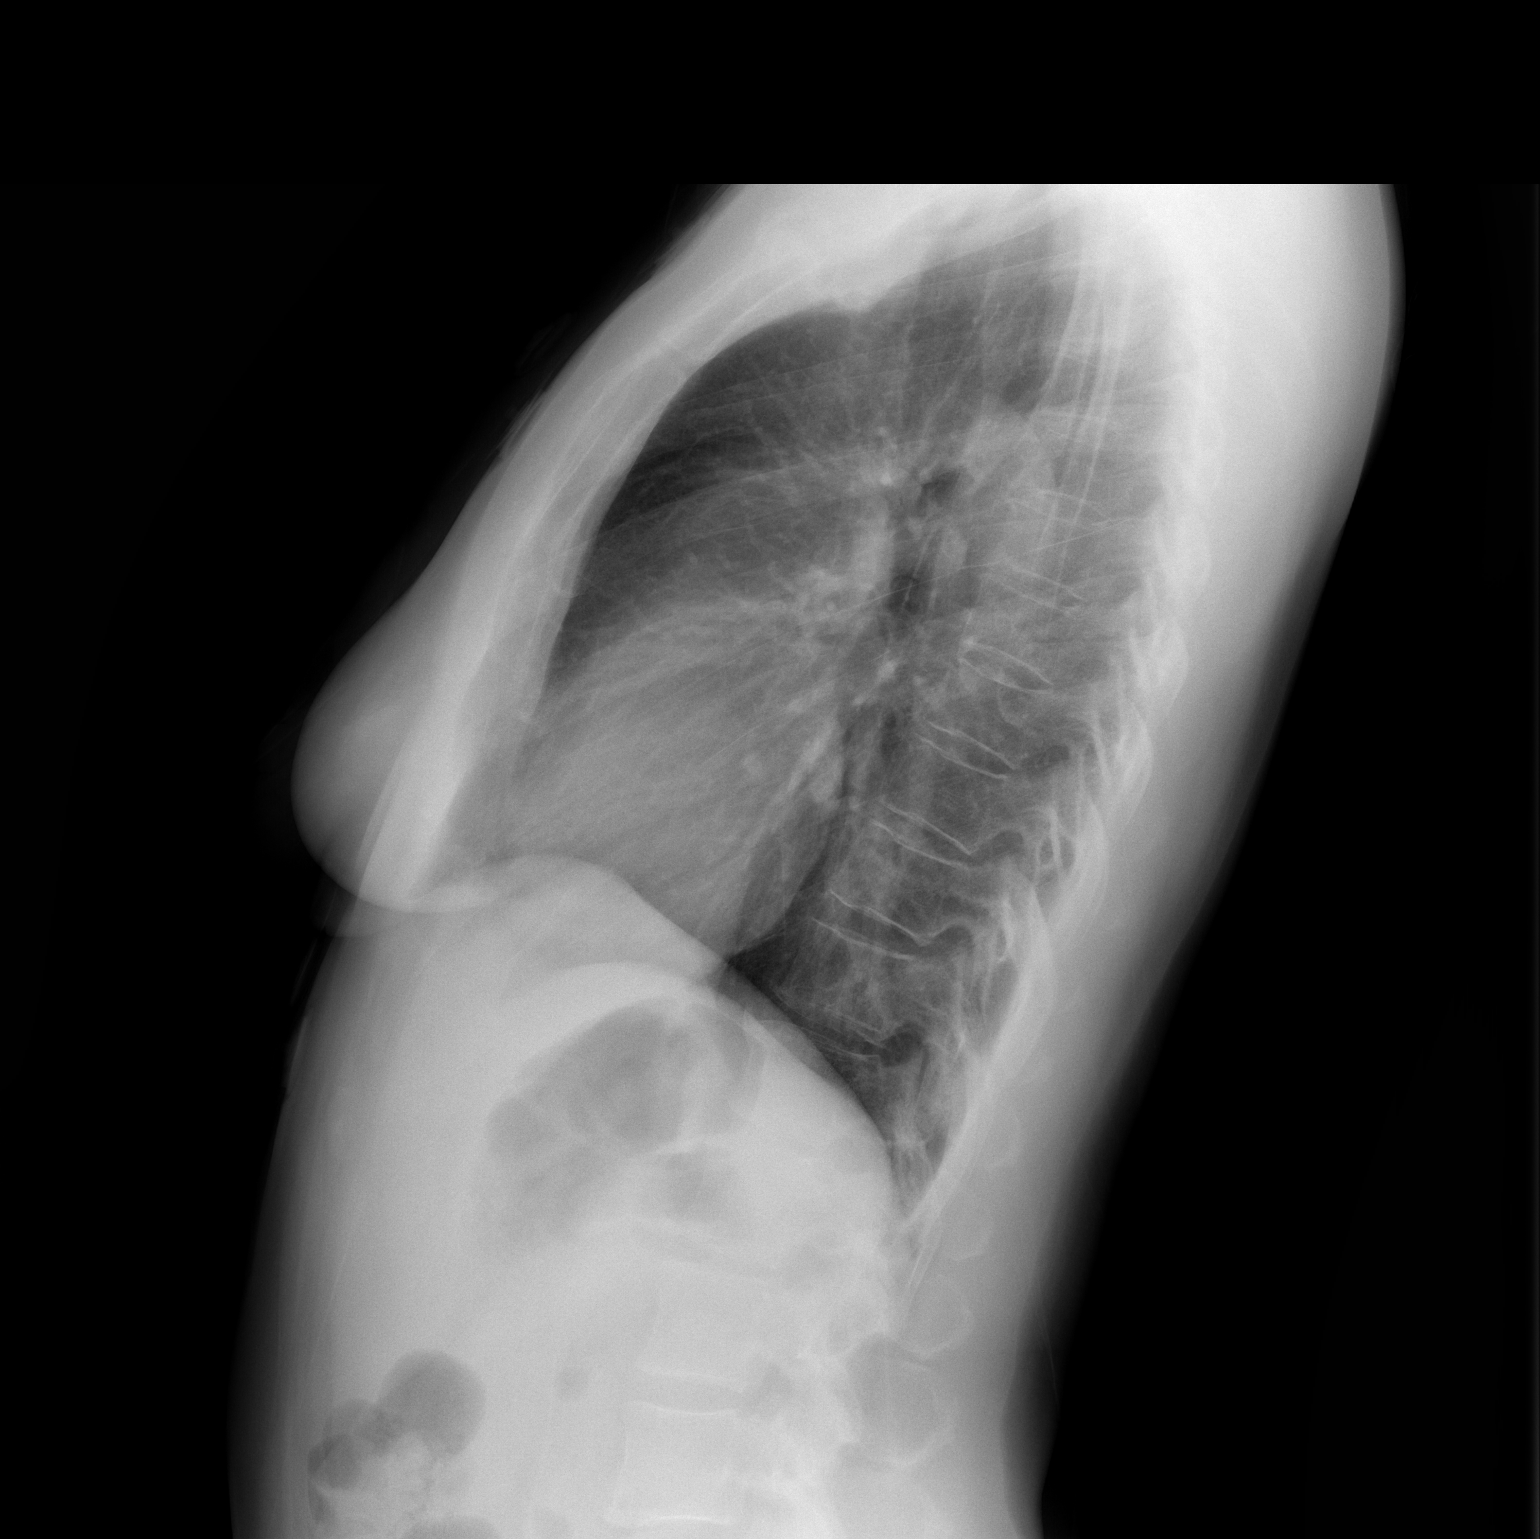

[2 of 2 positions shown; findings below may reference images not displayed]

FINDINGS: The heart size and mediastinal contours are within normal limits.
Both lungs are clear. The visualized skeletal structures are
unremarkable.
IMPRESSION: No active cardiopulmonary disease.

## 2024-04-30 ENCOUNTER — Emergency Department (HOSPITAL_BASED_OUTPATIENT_CLINIC_OR_DEPARTMENT_OTHER)
Admission: EM | Admit: 2024-04-30 | Discharge: 2024-04-30 | Disposition: A | Discharge status: Home / Self Care | Attending: Emergency Medicine | Admitting: Emergency Medicine

## 2024-04-30 ENCOUNTER — Encounter (HOSPITAL_BASED_OUTPATIENT_CLINIC_OR_DEPARTMENT_OTHER): Payer: Self-pay | Admitting: Emergency Medicine

## 2024-04-30 ENCOUNTER — Emergency Department (HOSPITAL_BASED_OUTPATIENT_CLINIC_OR_DEPARTMENT_OTHER)

## 2024-04-30 ENCOUNTER — Other Ambulatory Visit: Payer: Self-pay

## 2024-04-30 DIAGNOSIS — M25571 Pain in right ankle and joints of right foot: Secondary | ICD-10-CM | POA: Diagnosis present

## 2024-04-30 DIAGNOSIS — S93401A Sprain of unspecified ligament of right ankle, initial encounter: Secondary | ICD-10-CM | POA: Insufficient documentation

## 2024-04-30 DIAGNOSIS — X501XXA Overexertion from prolonged static or awkward postures, initial encounter: Secondary | ICD-10-CM | POA: Insufficient documentation

## 2024-04-30 DIAGNOSIS — Y9301 Activity, walking, marching and hiking: Secondary | ICD-10-CM | POA: Insufficient documentation

## 2024-04-30 NOTE — ED Triage Notes (Signed)
 Pt c/o R ankle pain, after mechanical fall today.

## 2024-04-30 NOTE — ED Provider Notes (Signed)
 [place] EMERGENCY DEPARTMENT AT [facility] Provider Note   CSN: [id number] Arrival date & time: 04/30/24  1612     Patient presents with: Ankle Pain   Christina Rice is a 58 y.o. female with no significant past medical history who presents with concern for right ankle pain that occurred just prior to arrival.  States she was walking when she twisted her right ankle.  This caused her to fall.  She denies hitting her head, denies any loss of consciousness.  She is reporting pain only to her right ankle currently.  Denies pain elsewhere.  Denies any numbness in her right foot or leg.  Reports difficulty walking on the right foot.    Ankle Pain      Prior to Admission medications   Medication Sig Start Date End Date Taking? Authorizing Provider  buPROPion (WELLBUTRIN) 100 MG tablet Take 100 mg by mouth daily.    [provider]  naproxen  (NAPROSYN ) 500 MG tablet Take 1 tablet (500 mg total) by mouth 2 (two) times daily. 11/03/13   Raford Lenis, MD  orphenadrine  (NORFLEX ) 100 MG tablet Take 1 tablet (100 mg total) by mouth 2 (two) times daily. 11/03/13   Raford Lenis, MD  oxyCODONE -acetaminophen  (ROXICET) 5-325 MG per tablet Take 1 tablet by mouth every 4 (four) hours as needed for severe pain. 11/03/13   Raford Lenis, MD    Allergies: Other    Review of Systems  Musculoskeletal:        Right ankle pain    Updated Vital Signs BP 135/82 (BP Location: Right Arm)   Pulse 77   Temp 98.1 F (36.7 C) (Oral)   Resp 16   Ht 5' 7 (1.702 m)   Wt 63.5 kg   LMP 10/09/2013   SpO2 99%   BMI 21.93 kg/m   Physical Exam Vitals and nursing note reviewed.  Constitutional:      Appearance: Normal appearance.  HENT:     Head: Atraumatic.  Cardiovascular:     Comments: Pedal pulse 2+ RLE Pulmonary:     Effort: Pulmonary effort is normal.  Musculoskeletal:     Comments: Right lower extremity:  General Generalized edema along the lateral aspect of the right  ankle and over the anterior aspect of the ankle.  No erythema, contusions, open wounds   Palpation Tender along the ATFL and at the base of the 4th metatarsal.  Non tender over the knee Nontender along the tibia and fibula Nontender on the lateral and medial malleolus Non-tender CFL, PTFL, achilles tendon Nontender along the 1st, 2nd, 3rd, and fifth metatarsals, and 1st-5th phalanges  ROM Decreased but intact ankle flexion, extension, inversion and eversion   Sensation: Sensation intact throughout the lower extremity    Neurological:     General: No focal deficit present.     Mental Status: She is alert.  Psychiatric:        Mood and Affect: Mood normal.        Behavior: Behavior normal.     (all labs ordered are listed, but only abnormal results are displayed) Labs Reviewed - No data to display  EKG: None  Radiology: DG Ankle Complete Right Result Date: 04/30/2024 CLINICAL DATA:  Pain after fall. EXAM: RIGHT ANKLE - COMPLETE 3+ VIEW; RIGHT FOOT COMPLETE - 3+ VIEW COMPARISON:  None Available. FINDINGS: No acute fracture or malalignment. Ankle mortise is congruent. Os peroneum is noted. Joint spaces are maintained. Mild dorsal talar spurring. No focal soft tissue  swelling. IMPRESSION: No acute osseous abnormality. Electronically Signed   By: Harrietta Sherry M.D.   On: 04/30/2024 17:47   DG Foot Complete Right Result Date: 04/30/2024 CLINICAL DATA:  Pain after fall. EXAM: RIGHT ANKLE - COMPLETE 3+ VIEW; RIGHT FOOT COMPLETE - 3+ VIEW COMPARISON:  None Available. FINDINGS: No acute fracture or malalignment. Ankle mortise is congruent. Os peroneum is noted. Joint spaces are maintained. Mild dorsal talar spurring. No focal soft tissue swelling. IMPRESSION: No acute osseous abnormality. Electronically Signed   By: Harrietta Sherry M.D.   On: 04/30/2024 17:47     Procedures   Medications Ordered in the ED - No data to display                                  Medical Decision  Making Amount and/or Complexity of Data Reviewed Radiology: ordered.     Differential diagnosis includes but is not limited to fracture, dislocation, ankle sprain, compartment syndrome  ED Course:  Upon initial evaluation, patient is well-appearing, no acute distress.  She reports twisting her right ankle earlier today, and is having pain just to the right ankle.  No pain elsewhere.  Calf soft and non-tender, neurovascularly intact in the right lower extremity, no concern for compartment syndrome, nerve injury, or vascular injury.  She has point tenderness to palpation of the base of the fourth metatarsal and is tender along the ATFL.  She does have diffuse edema along the lateral aspect of her ankle.  No tenderness over the tibia or fibula, no tenderness over the 1st, 2nd, 3rd, or 5th metatarsals.  No tenderness over the proximal phalanges.  She has intact range of motion of the right ankle, but with some difficulty due to pain. She declines any pain medication or ice for her foot/ankle at this time.    Imaging Studies ordered: I ordered imaging studies including x-ray right ankle, x-ray right foot I independently visualized the imaging with scope of interpretation limited to determining acute life threatening conditions related to emergency care. Imaging showed no acute abnormalities  I agree with the radiologist interpretation    Medications Given: None  Upon re-evaluation, patient remains well appearing. Discussed that her x-rays do not show any acute fracture or dislocation.  Given her exam and mechanism of injury, suspect ankle sprain.  We discussed placing her in a cam boot versus lace up ankle brace.  She would prefer to try the boot at this time for full support.  This was provided for her today.  She is able to ambulate without difficulty after being placed into the boot.  Stable and appropriate for discharge home.    Impression: Right ankle sprain  Disposition:  The patient  was discharged home with instructions to wear cam boot provided when ambulating to help with pain.  May wean out of boot as tolerated.  Tylenol  and ibuprofen  as needed for pain.  May ice and elevate left foot to help with pain and swelling.  Follow-up with her PCP or orthopedics within the next 1 to 2 weeks if ankle pain not improving on its own. Return precautions given.    This chart was dictated using voice recognition software, Dragon. Despite the best efforts of this provider to proofread and correct errors, errors may still occur which can change documentation meaning.       Final diagnoses:  Moderate right ankle sprain, initial encounter    ED  Discharge Orders     None          Veta Palma, PA-C 04/30/24 1827    Pamella Ozell LABOR, DO 05/05/24 1609

## 2024-04-30 NOTE — Discharge Instructions (Signed)
 You were seen here today for your ankle pain.  It appears you have a ankle sprain which is a injury of the ligaments in your ankle.  This will heal with time.  Your x-ray today did not show any signs of a fracture or dislocation  You have been placed into a walking boot to help with the pain.  You may wean out of the boot as tolerated. Please perform gentle range of motion exercises as provided (like writing the ABC's with your ankle) to prevent stiffness in the ankle and to strengthen the ankle.  Please follow-up with the orthopedic provider listed below if your ankle pain is not improving within the next 1 to 2 weeks for further management of your ankle sprain.  You may take up to 1000mg  of tylenol  every 6 hours as needed for pain.  Do not take more then 4g per day.  You may use up to 600mg  ibuprofen  every 6 hours as needed for pain.  Do not exceed 2.4g of ibuprofen  per day.  You may ice and elevate the ankle to help with pain.  Return to the ER if you have any numbness or tingling in your foot, worsening pain, any other new or concerning symptoms.

## 2024-04-30 NOTE — ED Notes (Signed)
 Pt declined ice for ankle at this time.
